# Patient Record
Sex: Male | Born: 1990
Health system: Southern US, Community
[De-identification: ages and names within clinical notes are randomized; demographics above are authoritative.]

## PROBLEM LIST (undated history)

## (undated) DIAGNOSIS — J45909 Unspecified asthma, uncomplicated: Secondary | ICD-10-CM

---
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
## 2011-01-06 NOTE — Op Note (Signed)
Andrew Torres, Andrew Torres              ACCOUNT NO.:  [id number]   MEDICAL RECORD NO.:  [id number]          PATIENT TYPE:  AMB   LOCATION:  DSC                          FACILITY:  [facility]   PHYSICIAN:  Lucky Cowboy, MD         DATE OF BIRTH:  1990-11-18   DATE OF PROCEDURE:  02/14/2008  DATE OF DISCHARGE:                               OPERATIVE REPORT   PREOPERATIVE DIAGNOSIS:  Closed nasal fracture.   POSTOPERATIVE DIAGNOSIS:  Closed nasal fracture.   PROCEDURE:  Closed reduction, nasal fracture.   ANESTHESIA:  General endotracheal anesthesia.   ESTIMATED BLOOD LOSS:  None.   COMPLICATIONS:  None.   INDICATIONS:  This patient is a 20 year old male who reports having a  basketball goal fall on his nose causing a severe right-sided external  nasal deviation, which was not present prior to the injury.  This is  certainly confirmed on physical examination in the office.  For these  reasons, closed reduction is performed.   PROCEDURE:  The patient was taken to the operating room and placed on  the table in the supine position.  He was then placed under general  endotracheal anesthesia and each of the nasal cavities decongested with  Afrin on the cottonoid pledget.  The nasal lift was then carefully  measured to the medial canthi.  The external pressure was applied to  medialize the bone.  Elevation of the bone was also performed on the  depressed left side.  Nasal packs were placed in each side to prevent  depression postoperatively.  A [place] splint was applied.  The patient  was awakened from anesthesia and taken to the Postanesthesia Care Unit  in stable condition.  There were no complications.      Lucky Cowboy, MD  Electronically Signed     SJ/MEDQ  D:  03/17/2008  T:  03/17/2008  Job:  [id number]   cc:   [facility]

## 2011-05-21 LAB — POCT HEMOGLOBIN-HEMACUE: Hemoglobin: 15.2

## 2012-01-29 ENCOUNTER — Telehealth: Payer: Self-pay | Admitting: *Deleted

## 2012-01-29 NOTE — Telephone Encounter (Signed)
Ben from [employer] to verify pt last OV with MD Lowne, noted 03-2009

## 2013-10-27 ENCOUNTER — Telehealth: Payer: Self-pay

## 2013-10-27 NOTE — Telephone Encounter (Signed)
Left message for call back Non-identifiable  New patient 

## 2013-10-31 ENCOUNTER — Telehealth: Payer: Self-pay | Admitting: Family Medicine

## 2013-10-31 ENCOUNTER — Ambulatory Visit (INDEPENDENT_AMBULATORY_CARE_PROVIDER_SITE_OTHER): Payer: BC Managed Care – PPO | Admitting: Family Medicine

## 2013-10-31 ENCOUNTER — Encounter: Payer: Self-pay | Admitting: Family Medicine

## 2013-10-31 VITALS — BP 112/70 | HR 68 | Temp 98.5°F | Ht 70.0 in | Wt 200.8 lb

## 2013-10-31 DIAGNOSIS — Z23 Encounter for immunization: Secondary | ICD-10-CM

## 2013-10-31 DIAGNOSIS — Z0000 Encounter for general adult medical examination without abnormal findings: Secondary | ICD-10-CM

## 2013-10-31 DIAGNOSIS — N469 Male infertility, unspecified: Secondary | ICD-10-CM | POA: Insufficient documentation

## 2013-10-31 LAB — CBC WITH DIFFERENTIAL/PLATELET
BASOS PCT: 0.7 % (ref 0.0–3.0)
Basophils Absolute: 0 10*3/uL (ref 0.0–0.1)
EOS ABS: 0.1 10*3/uL (ref 0.0–0.7)
Eosinophils Relative: 1.6 % (ref 0.0–5.0)
HCT: 45 % (ref 39.0–52.0)
Hemoglobin: 15.4 g/dL (ref 13.0–17.0)
LYMPHS ABS: 1.6 10*3/uL (ref 0.7–4.0)
Lymphocytes Relative: 34.2 % (ref 12.0–46.0)
MCHC: 34.2 g/dL (ref 30.0–36.0)
MCV: 96 fl (ref 78.0–100.0)
MONO ABS: 0.4 10*3/uL (ref 0.1–1.0)
Monocytes Relative: 8.6 % (ref 3.0–12.0)
NEUTROS PCT: 54.9 % (ref 43.0–77.0)
Neutro Abs: 2.5 10*3/uL (ref 1.4–7.7)
PLATELETS: 234 10*3/uL (ref 150.0–400.0)
RBC: 4.69 Mil/uL (ref 4.22–5.81)
RDW: 12.4 % (ref 11.5–14.6)
WBC: 4.6 10*3/uL (ref 4.5–10.5)

## 2013-10-31 LAB — BASIC METABOLIC PANEL
BUN: 12 mg/dL (ref 6–23)
CHLORIDE: 105 meq/L (ref 96–112)
CO2: 27 meq/L (ref 19–32)
CREATININE: 1 mg/dL (ref 0.4–1.5)
Calcium: 9.4 mg/dL (ref 8.4–10.5)
GFR: 123.65 mL/min (ref >60.00)
Glucose, Bld: 77 mg/dL (ref 70–99)
Potassium: 4 mEq/L (ref 3.5–5.1)
Sodium: 139 mEq/L (ref 135–145)

## 2013-10-31 LAB — LIPID PANEL
CHOL/HDL RATIO: 3
CHOLESTEROL: 156 mg/dL (ref 0–200)
HDL: 54.2 mg/dL (ref >39.00)
LDL Cholesterol: 88 mg/dL (ref 0–99)
TRIGLYCERIDES: 71 mg/dL (ref 0.0–149.0)
VLDL: 14.2 mg/dL (ref 0.0–40.0)

## 2013-10-31 LAB — TSH: TSH: 0.49 u[IU]/mL (ref 0.35–5.50)

## 2013-10-31 NOTE — Telephone Encounter (Signed)
Relevant patient education assigned to patient using Emmi. ° °

## 2013-10-31 NOTE — Patient Instructions (Signed)
Preventive Care for Adults, Male A healthy lifestyle and preventive care can promote health and wellness. Preventive health guidelines for men include the following key practices:  A routine yearly physical is a good way to check with your health care provider about your health and preventative screening. It is a chance to share any concerns and updates on your health and to receive a thorough exam.  Visit your dentist for a routine exam and preventative care every 6 months. Brush your teeth twice a day and floss once a day. Good oral hygiene prevents tooth decay and gum disease.  The frequency of eye exams is based on your age, health, family medical history, use of contact lenses, and other factors. Follow your health care provider's recommendations for frequency of eye exams.  Eat a healthy diet. Foods such as vegetables, fruits, whole grains, low-fat dairy products, and lean protein foods contain the nutrients you need without too many calories. Decrease your intake of foods high in solid fats, added sugars, and salt. Eat the right amount of calories for you.Get information about a proper diet from your health care provider, if necessary.  Regular physical exercise is one of the most important things you can do for your health. Most adults should get at least 150 minutes of moderate-intensity exercise (any activity that increases your heart rate and causes you to sweat) each week. In addition, most adults need muscle-strengthening exercises on 2 or more days a week.  Maintain a healthy weight. The body mass index (BMI) is a screening tool to identify possible weight problems. It provides an estimate of body fat based on height and weight. Your health care provider can find your BMI and can help you achieve or maintain a healthy weight.For adults 20 years and older:  A BMI below 18.5 is considered underweight.  A BMI of 18.5 to 24.9 is normal.  A BMI of 25 to 29.9 is considered  overweight.  A BMI of 30 and above is considered obese.  Maintain normal blood lipids and cholesterol levels by exercising and minimizing your intake of saturated fat. Eat a balanced diet with plenty of fruit and vegetables. Blood tests for lipids and cholesterol should begin at age 42 and be repeated every 5 years. If your lipid or cholesterol levels are high, you are over 50, or you are at high risk for heart disease, you may need your cholesterol levels checked more frequently.Ongoing high lipid and cholesterol levels should be treated with medicines if diet and exercise are not working.  If you smoke, find out from your health care provider how to quit. If you do not use tobacco, do not start.  Lung cancer screening is recommended for adults aged 24 80 years who are at high risk for developing lung cancer because of a history of smoking. A yearly low-dose CT scan of the lungs is recommended for people who have at least a 30-pack-year history of smoking and are a current smoker or have quit within the past 15 years. A pack year of smoking is smoking an average of 1 pack of cigarettes a day for 1 year (for example: 1 pack a day for 30 years or 2 packs a day for 15 years). Yearly screening should continue until the smoker has stopped smoking for at least 15 years. Yearly screening should be stopped for people who develop a health problem that would prevent them from having lung cancer treatment.  If you choose to drink alcohol, do not have  more than 2 drinks per day. One drink is considered to be 12 ounces (355 mL) of beer, 5 ounces (148 mL) of wine, or 1.5 ounces (44 mL) of liquor.  Avoid use of street drugs. Do not share needles with anyone. Ask for help if you need support or instructions about stopping the use of drugs.  High blood pressure causes heart disease and increases the risk of stroke. Your blood pressure should be checked at least every 1 2 years. Ongoing high blood pressure should be  treated with medicines, if weight loss and exercise are not effective.  If you are 75 23 years old, ask your health care provider if you should take aspirin to prevent heart disease.  Diabetes screening involves taking a blood sample to check your fasting blood sugar level. This should be done once every 3 years, after age 19, if you are within normal weight and without risk factors for diabetes. Testing should be considered at a younger age or be carried out more frequently if you are overweight and have at least 1 risk factor for diabetes.  Colorectal cancer can be detected and often prevented. Most routine colorectal cancer screening begins at the age of 47 and continues through age 80. However, your health care provider may recommend screening at an earlier age if you have risk factors for colon cancer. On a yearly basis, your health care provider may provide home test kits to check for hidden blood in the stool. Use of a small camera at the end of a tube to directly examine the colon (sigmoidoscopy or colonoscopy) can detect the earliest forms of colorectal cancer. Talk to your health care provider about this at age 66, when routine screening begins. Direct exam of the colon should be repeated every 5 10 years through age 19, unless early forms of precancerous polyps or small growths are found.  People who are at an increased risk for hepatitis B should be screened for this virus. You are considered at high risk for hepatitis B if:  You were born in a country where hepatitis B occurs often. Talk with your health care provider about which countries are considered high-risk.  Your parents were born in a high-risk country and you have not received a shot to protect against hepatitis B (hepatitis B vaccine).  You have HIV or AIDS.  You use needles to inject street drugs.  You live with, or have sex with, someone who has hepatitis B.  You are a man who has sex with other men (MSM).  You get  hemodialysis treatment.  You take certain medicines for conditions such as cancer, organ transplantation, and autoimmune conditions.  Hepatitis C blood testing is recommended for all people born from [birth year] through 1965 and any individual with known risks for hepatitis C.  Practice safe sex. Use condoms and avoid high-risk sexual practices to reduce the spread of sexually transmitted infections (STIs). STIs include gonorrhea, chlamydia, syphilis, trichomonas, herpes, HPV, and human immunodeficiency virus (HIV). Herpes, HIV, and HPV are viral illnesses that have no cure. They can result in disability, cancer, and death.  A one-time screening for abdominal aortic aneurysm (AAA) and surgical repair of large AAAs by ultrasound are recommended for men ages 94 to 74 years who are current or former smokers.  Healthy men should no longer receive prostate-specific antigen (PSA) blood tests as part of routine cancer screening. Talk with your health care provider about prostate cancer screening.  Testicular cancer screening is not recommended  for adult males who have no symptoms. Screening includes self-exam, a health care provider exam, and other screening tests. Consult with your health care provider about any symptoms you have or any concerns you have about testicular cancer.  Use sunscreen. Apply sunscreen liberally and repeatedly throughout the day. You should seek shade when your shadow is shorter than you. Protect yourself by wearing long sleeves, pants, a wide-brimmed hat, and sunglasses year round, whenever you are outdoors.  Once a month, do a whole-body skin exam, using a mirror to look at the skin on your back. Tell your health care provider about new moles, moles that have irregular borders, moles that are larger than a pencil eraser, or moles that have changed in shape or color.  Stay current with required vaccines (immunizations).  Influenza vaccine. All adults should be immunized every  year.  Tetanus, diphtheria, and acellular pertussis (Td, Tdap) vaccine. An adult who has not previously received Tdap or who does not know his vaccine status should receive 1 dose of Tdap. This initial dose should be followed by tetanus and diphtheria toxoids (Td) booster doses every 10 years. Adults with an unknown or incomplete history of completing a 3-dose immunization series with Td-containing vaccines should begin or complete a primary immunization series including a Tdap dose. Adults should receive a Td booster every 10 years.  Varicella vaccine. An adult without evidence of immunity to varicella should receive 2 doses or a second dose if he has previously received 1 dose.  Human papillomavirus (HPV) vaccine. Males aged 44 21 years who have not received the vaccine previously should receive the 3-dose series. Males aged 43 26 years may be immunized. Immunization is recommended through the age of 50 years for any male who has sex with males and did not get any or all doses earlier. Immunization is recommended for any person with an immunocompromised condition through the age of 23 years if he did not get any or all doses earlier. During the 3-dose series, the second dose should be obtained 4 8 weeks after the first dose. The third dose should be obtained 24 weeks after the first dose and 16 weeks after the second dose.  Zoster vaccine. One dose is recommended for adults aged 96 years or older unless certain conditions are present.  Measles, mumps, and rubella (MMR) vaccine. Adults born before 55 generally are considered immune to measles and mumps. Adults born in [birth year] or later should have 1 or more doses of MMR vaccine unless there is a contraindication to the vaccine or there is laboratory evidence of immunity to each of the three diseases. A routine second dose of MMR vaccine should be obtained at least 28 days after the first dose for students attending postsecondary schools, health care  workers, or international travelers. People who received inactivated measles vaccine or an unknown type of measles vaccine during 1963 1967 should receive 2 doses of MMR vaccine. People who received inactivated mumps vaccine or an unknown type of mumps vaccine before 1979 and are at high risk for mumps infection should consider immunization with 2 doses of MMR vaccine. Unvaccinated health care workers born before [birth year] who lack laboratory evidence of measles, mumps, or rubella immunity or laboratory confirmation of disease should consider measles and mumps immunization with 2 doses of MMR vaccine or rubella immunization with 1 dose of MMR vaccine.  Pneumococcal 13-valent conjugate (PCV13) vaccine. When indicated, a person who is uncertain of his immunization history and has no record of immunization  should receive the PCV13 vaccine. An adult aged 67 years or older who has certain medical conditions and has not been previously immunized should receive 1 dose of PCV13 vaccine. This PCV13 should be followed with a dose of pneumococcal polysaccharide (PPSV23) vaccine. The PPSV23 vaccine dose should be obtained at least 8 weeks after the dose of PCV13 vaccine. An adult aged 79 years or older who has certain medical conditions and previously received 1 or more doses of PPSV23 vaccine should receive 1 dose of PCV13. The PCV13 vaccine dose should be obtained 1 or more years after the last PPSV23 vaccine dose.  Pneumococcal polysaccharide (PPSV23) vaccine. When PCV13 is also indicated, PCV13 should be obtained first. All adults aged 74 years and older should be immunized. An adult younger than age 50 years who has certain medical conditions should be immunized. Any person who resides in a nursing home or long-term care facility should be immunized. An adult smoker should be immunized. People with an immunocompromised condition and certain other conditions should receive both PCV13 and PPSV23 vaccines. People with human  immunodeficiency virus (HIV) infection should be immunized as soon as possible after diagnosis. Immunization during chemotherapy or radiation therapy should be avoided. Routine use of PPSV23 vaccine is not recommended for American Indians, [place] Natives, or people younger than 65 years unless there are medical conditions that require PPSV23 vaccine. When indicated, people who have unknown immunization and have no record of immunization should receive PPSV23 vaccine. One-time revaccination 5 years after the first dose of PPSV23 is recommended for people aged 41 64 years who have chronic kidney failure, nephrotic syndrome, asplenia, or immunocompromised conditions. People who received 1 2 doses of PPSV23 before age 15 years should receive another dose of PPSV23 vaccine at age 48 years or later if at least 5 years have passed since the previous dose. Doses of PPSV23 are not needed for people immunized with PPSV23 at or after age 69 years.  Meningococcal vaccine. Adults with asplenia or persistent complement component deficiencies should receive 2 doses of quadrivalent meningococcal conjugate (MenACWY-D) vaccine. The doses should be obtained at least 2 months apart. Microbiologists working with certain meningococcal bacteria, [place] recruits, people at risk during an outbreak, and people who travel to or live in countries with a high rate of meningitis should be immunized. A first-year college student up through age 7 years who is living in a residence hall should receive a dose if he did not receive a dose on or after his 16th birthday. Adults who have certain high-risk conditions should receive one or more doses of vaccine.  Hepatitis A vaccine. Adults who wish to be protected from this disease, have certain high-risk conditions, work with hepatitis A-infected animals, work in hepatitis A research labs, or travel to or work in countries with a high rate of hepatitis A should be immunized. Adults who were  previously unvaccinated and who anticipate close contact with an international adoptee during the first 60 days after arrival in the [place] from a country with a high rate of hepatitis A should be immunized.  Hepatitis B vaccine. Adults who wish to be protected from this disease, have certain high-risk conditions, may be exposed to blood or other infectious body fluids, are household contacts or sex partners of hepatitis B positive people, are clients or workers in certain care facilities, or travel to or work in countries with a high rate of hepatitis B should be immunized.  Haemophilus influenzae type b (Hib) vaccine. A  previously unvaccinated person with asplenia or sickle cell disease or having a scheduled splenectomy should receive 1 dose of Hib vaccine. Regardless of previous immunization, a recipient of a hematopoietic stem cell transplant should receive a 3-dose series 6 12 months after his successful transplant. Hib vaccine is not recommended for adults with HIV infection. Preventive Service / Frequency Ages 62 to 3  Blood pressure check.** / Every 1 to 2 years.  Lipid and cholesterol check.** / Every 5 years beginning at age 43.  Hepatitis C blood test.** / For any individual with known risks for hepatitis C.  Skin self-exam. / Monthly.  Influenza vaccine. / Every year.  Tetanus, diphtheria, and acellular pertussis (Tdap, Td) vaccine.** / Consult your health care provider. 1 dose of Td every 10 years.  Varicella vaccine.** / Consult your health care provider.  HPV vaccine. / 3 doses over 6 months, if 48 or younger.  Measles, mumps, rubella (MMR) vaccine.** / You need at least 1 dose of MMR if you were born in 1957 or later. You may also need a second dose.  Pneumococcal 13-valent conjugate (PCV13) vaccine.** / Consult your health care provider.  Pneumococcal polysaccharide (PPSV23) vaccine.** / 1 to 2 doses if you smoke cigarettes or if you have certain  conditions.  Meningococcal vaccine.** / 1 dose if you are age 8 to 70 years and a Market researcher living in a residence hall, or have one of several medical conditions. You may also need additional booster doses.  Hepatitis A vaccine.** / Consult your health care provider.  Hepatitis B vaccine.** / Consult your health care provider.  Haemophilus influenzae type b (Hib) vaccine.** / Consult your health care provider. Ages 48 to 32  Blood pressure check.** / Every 1 to 2 years.  Lipid and cholesterol check.** / Every 5 years beginning at age 38.  Lung cancer screening. / Every year if you are aged 40 80 years and have a 30-pack-year history of smoking and currently smoke or have quit within the past 15 years. Yearly screening is stopped once you have quit smoking for at least 15 years or develop a health problem that would prevent you from having lung cancer treatment.  Fecal occult blood test (FOBT) of stool. / Every year beginning at age 4 and continuing until age 70. You may not have to do this test if you get a colonoscopy every 10 years.  Flexible sigmoidoscopy** or colonoscopy.** / Every 5 years for a flexible sigmoidoscopy or every 10 years for a colonoscopy beginning at age 76 and continuing until age 62.  Hepatitis C blood test.** / For all people born from [birth year] through 1965 and any individual with known risks for hepatitis C.  Skin self-exam. / Monthly.  Influenza vaccine. / Every year.  Tetanus, diphtheria, and acellular pertussis (Tdap/Td) vaccine.** / Consult your health care provider. 1 dose of Td every 10 years.  Varicella vaccine.** / Consult your health care provider.  Zoster vaccine.** / 1 dose for adults aged 60 years or older.  Measles, mumps, rubella (MMR) vaccine.** / You need at least 1 dose of MMR if you were born in 1957 or later. You may also need a second dose.  Pneumococcal 13-valent conjugate (PCV13) vaccine.** / Consult your health care  provider.  Pneumococcal polysaccharide (PPSV23) vaccine.** / 1 to 2 doses if you smoke cigarettes or if you have certain conditions.  Meningococcal vaccine.** / Consult your health care provider.  Hepatitis A vaccine.** / Consult your health care  provider.  Hepatitis B vaccine.** / Consult your health care provider.  Haemophilus influenzae type b (Hib) vaccine.** / Consult your health care provider. Ages 65 and over  Blood pressure check.** / Every 1 to 2 years.  Lipid and cholesterol check.**/ Every 5 years beginning at age 20.  Lung cancer screening. / Every year if you are aged 55 80 years and have a 30-pack-year history of smoking and currently smoke or have quit within the past 15 years. Yearly screening is stopped once you have quit smoking for at least 15 years or develop a health problem that would prevent you from having lung cancer treatment.  Fecal occult blood test (FOBT) of stool. / Every year beginning at age 50 and continuing until age 75. You may not have to do this test if you get a colonoscopy every 10 years.  Flexible sigmoidoscopy** or colonoscopy.** / Every 5 years for a flexible sigmoidoscopy or every 10 years for a colonoscopy beginning at age 50 and continuing until age 75.  Hepatitis C blood test.** / For all people born from 1945 through 1965 and any individual with known risks for hepatitis C.  Abdominal aortic aneurysm (AAA) screening.** / A one-time screening for ages 65 to 75 years who are current or former smokers.  Skin self-exam. / Monthly.  Influenza vaccine. / Every year.  Tetanus, diphtheria, and acellular pertussis (Tdap/Td) vaccine.** / 1 dose of Td every 10 years.  Varicella vaccine.** / Consult your health care provider.  Zoster vaccine.** / 1 dose for adults aged 60 years or older.  Pneumococcal 13-valent conjugate (PCV13) vaccine.** / Consult your health care provider.  Pneumococcal polysaccharide (PPSV23) vaccine.** / 1 dose for all  adults aged 65 years and older.  Meningococcal vaccine.** / Consult your health care provider.  Hepatitis A vaccine.** / Consult your health care provider.  Hepatitis B vaccine.** / Consult your health care provider.  Haemophilus influenzae type b (Hib) vaccine.** / Consult your health care provider. **Family history and personal history of risk and conditions may change your health care provider's recommendations. Document Released: 10/06/2001 Document Revised: 05/31/2013 Document Reviewed: 01/05/2011 [employer] Patient Information 2014 [employer].  

## 2013-10-31 NOTE — Progress Notes (Signed)
Pre visit review using our clinic review tool, if applicable. No additional management support is needed unless otherwise documented below in the visit note. 

## 2013-10-31 NOTE — Progress Notes (Signed)
Patient ID: Andrew Torres, male   DOB: 1991/04/15, 23 y.o.   MRN: [id number]   Subjective:    Patient ID: Andrew HearingDonovan I Torres, male    DOB: 1991/04/15, 23 y.o.   MRN: [id number] HPI Pt is here for cpe and c/o concerns with fertility.  Pt is trying to have a baby with his girlfriend.  They have been trying for over a year and she is concerned there is something wrong with him.     History reviewed. No pertinent past medical history. No current outpatient prescriptions on file prior to visit.   No current facility-administered medications on file prior to visit.   History   Social History  . Marital Status: Single    Spouse Name: N/A    Number of Children: N/A  . Years of Education: N/A   Occupational History  . student     criminal justice   Social History Main Topics  . Smoking status: Current Every Day Smoker -- 0.50 packs/day for 7 years    Types: Cigarettes    Start date: 11/01/2006  . Smokeless tobacco: Never Used  . Alcohol Use: Yes  . Drug Use: No  . Sexual Activity: Yes    Partners: Female   Other Topics Concern  . Not on file   Social History Narrative   Exercise-- no   Criminal justice student   Family History  Problem Relation Age of Onset  . Diabetes Maternal Grandfather    No Known Allergies        Objective:    BP 112/70  Pulse 68  Temp(Src) 98.5 F (36.9 C) (Oral)  Ht 5\' 10"  (1.778 m)  Wt 200 lb 12.8 oz (91.082 kg)  BMI 28.81 kg/m2  SpO2 98% General appearance: alert, cooperative, appears stated age and no distress Head: Normocephalic, without obvious abnormality, atraumatic Eyes: conjunctivae/corneas clear. PERRL, EOM's intact. Fundi benign. Ears: normal TM's and external ear canals both ears Nose: Nares normal. Septum midline. Mucosa normal. No drainage or sinus tenderness. Throat: lips, mucosa, and tongue normal; teeth and gums normal Neck: no adenopathy, no carotid bruit, no JVD, supple, symmetrical, trachea midline and thyroid not  enlarged, symmetric, no tenderness/mass/nodules Back: symmetric, no curvature. ROM normal. No CVA tenderness. Lungs: clear to auscultation bilaterally Chest wall: no tenderness Heart: regular rate and rhythm, S1, S2 normal, no murmur, click, rub or gallop Abdomen: soft, non-tender; bowel sounds normal; no masses,  no organomegaly Male genitalia: normal, penis: no lesions or discharge. testes: no masses or tenderness. no hernias Rectal: normal tone, normal prostate, no masses or tenderness Extremities: extremities normal, atraumatic, no cyanosis or edema Pulses: 2+ and symmetric Skin: Skin color, texture, turgor normal. No rashes or lesions Lymph nodes: Cervical, supraclavicular, and axillary nodes normal. Neurologic: Alert and oriented X 3, normal strength and tone. Normal symmetric reflexes. Normal coordination and gait        Assessment & Plan:  1-Preventative health care Check labs,  See AVS - Basic metabolic panel - CBC with Differential - Hepatic function panel - Lipid panel - POCT urinalysis dipstick - TSH  3. 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 D/w pt age and school and he states they want to have a baby.  I also discussed importance of her getting checked as well.

## 2013-11-01 LAB — HEPATIC FUNCTION PANEL
ALT: 31 U/L (ref 0–53)
AST: 26 U/L (ref 0–37)
Albumin: 4.2 g/dL (ref 3.5–5.2)
Alkaline Phosphatase: 48 U/L (ref 39–117)
BILIRUBIN DIRECT: 0 mg/dL (ref 0.0–0.3)
BILIRUBIN TOTAL: 0.6 mg/dL (ref 0.3–1.2)
TOTAL PROTEIN: 7.4 g/dL (ref 6.0–8.3)

## 2013-11-01 LAB — HSV 2 ANTIBODY, IGG: HSV 2 Glycoprotein G Ab, IgG: <0.1 IV

## 2013-11-01 LAB — GC/CHLAMYDIA PROBE AMP
CT PROBE, AMP APTIMA: POSITIVE — AB
GC PROBE AMP APTIMA: NEGATIVE

## 2013-11-01 LAB — RPR

## 2013-11-01 LAB — HIV ANTIBODY (ROUTINE TESTING W REFLEX): HIV: NONREACTIVE

## 2013-11-02 ENCOUNTER — Telehealth: Payer: Self-pay

## 2013-11-02 MED ORDER — AZITHROMYCIN 250 MG PO TABS: ORAL_TABLET | ORAL | Status: DC

## 2013-11-02 NOTE — Telephone Encounter (Signed)
Message copied by Arnette NorrisPAYNE, Yukio Bisping P on Thu Nov 02, 2013 10:59 AM ------      Message from: Lelon PerlaLOWNE, YVONNE R      Created: Wed Nov 01, 2013 11:48 AM       + chlamydia----   zithromax 250mg   #4  1000mg  x1       Still waiting for others ------

## 2013-11-02 NOTE — Telephone Encounter (Addendum)
Discussed with patient and he voiced understanding, advised e and his partner both will need to be treated, I encourage the use of condoms and made aware this information will be reported to the local Health department for statistical purposes. Answered all questions in reference to mode of transmission and ensuring to complete the treatment of both parties prior to relations to decrease the chances of reinfection. He voiced understanding and did not have anymore questions at this time. Info sent to the health department      KP

## 2017-03-26 ENCOUNTER — Emergency Department (HOSPITAL_BASED_OUTPATIENT_CLINIC_OR_DEPARTMENT_OTHER)
Admission: EM | Admit: 2017-03-26 | Discharge: 2017-03-26 | Disposition: A | Discharge status: Home / Self Care | Payer: Self-pay | Attending: Emergency Medicine | Admitting: Emergency Medicine

## 2017-03-26 ENCOUNTER — Encounter (HOSPITAL_BASED_OUTPATIENT_CLINIC_OR_DEPARTMENT_OTHER): Payer: Self-pay | Admitting: *Deleted

## 2017-03-26 DIAGNOSIS — J45909 Unspecified asthma, uncomplicated: Secondary | ICD-10-CM | POA: Insufficient documentation

## 2017-03-26 DIAGNOSIS — J069 Acute upper respiratory infection, unspecified: Secondary | ICD-10-CM | POA: Insufficient documentation

## 2017-03-26 DIAGNOSIS — R05 Cough: Secondary | ICD-10-CM | POA: Insufficient documentation

## 2017-03-26 DIAGNOSIS — B9789 Other viral agents as the cause of diseases classified elsewhere: Secondary | ICD-10-CM

## 2017-03-26 DIAGNOSIS — F17210 Nicotine dependence, cigarettes, uncomplicated: Secondary | ICD-10-CM | POA: Insufficient documentation

## 2017-03-26 DIAGNOSIS — R0981 Nasal congestion: Secondary | ICD-10-CM | POA: Insufficient documentation

## 2017-03-26 HISTORY — DX: Unspecified asthma, uncomplicated: J45.909

## 2017-03-26 MED ORDER — GUAIFENESIN 100 MG/5ML PO SYRP: 100.0000 mg | ORAL_SOLUTION | ORAL | 0 refills | Status: DC | PRN

## 2017-03-26 MED ORDER — IBUPROFEN 800 MG PO TABS: 800.0000 mg | ORAL_TABLET | Freq: Three times a day (TID) | ORAL | 0 refills | Status: DC

## 2017-03-26 MED ORDER — IBUPROFEN 800 MG PO TABS
800.0000 mg | ORAL_TABLET | Freq: Once | ORAL | Status: AC
Start: 2017-03-26 — End: 2017-03-26
  Administered 2017-03-26: 800 mg via ORAL
  Filled 2017-03-26: qty 1

## 2017-03-26 MED FILL — ROBAFEN 100 MG/5 ML SYRUP: 100 | 4 days supply | Qty: 118 | Fill #0

## 2017-03-26 NOTE — Discharge Instructions (Signed)
Return to the ED with any concerns including difficulty breathing, vomiting and not able to keep down liquids, decreased urine output, decreased level of alertness/lethargy, or any other alarming symptoms  °

## 2017-03-26 NOTE — ED Provider Notes (Signed)
[facility]   CSN: [id number] Arrival date & time: 03/26/17  0946     History   Chief Complaint Chief Complaint  Patient presents with  . Generalized Body Aches  . Sore Throat    HPI Andrew Torres is a 26 y.o. male.  HPI  Pt presenting with c/o congestion, body aches, mild sore throat, mild cough.  He states symptoms started yesterday while he was at work, worse this morning.  He came in to be checked and will need a work note.  He has tried a multi symptom medicine that he did not feel helped very much.  Cough is nonproductive.  He does not feel short of breath or tightness in his chest.  No fever/chills.  Does c/o diffuse body aches.  No specific sick contacts or recent travel.  There are no other associated systemic symptoms, there are no other alleviating or modifying factors.   Past Medical History:  Diagnosis Date  . Asthma     Patient Active Problem List   Diagnosis Date Noted  . Preventative health care 10/31/2013  . 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 NOSE SURGERY  2006   Broken Nose       Home Medications    Prior to Admission medications   Medication Sig Start Date End Date Taking? Authorizing Provider  azithromycin (ZITHROMAX) 250 MG tablet Take all 4 tablets today 11/02/13   Zola ButtonLowne Chase, Grayling CongressYvonne R, DO  guaifenesin (ROBITUSSIN) 100 MG/5ML syrup Take 5-10 mLs (100-200 mg total) by mouth every 4 (four) hours as needed for cough. 03/26/17   Mabe, Latanya MaudlinMartha L, MD  ibuprofen (ADVIL,MOTRIN) 800 MG tablet Take 1 tablet (800 mg total) by mouth 3 (three) times daily. 03/26/17   Mabe, Latanya MaudlinMartha L, MD    Family History Family History  Problem Relation Age of Onset  . Diabetes Maternal Grandfather     Social History Social History  Substance Use Topics  . Smoking status: Current Every Day Smoker    Packs/day: 0.50    Years: 7.00    Types: Cigarettes    Start date: 11/01/2006  .  Smokeless tobacco: Never Used  . Alcohol use Yes     Comment: occ     Allergies   Patient has no known allergies.   Review of Systems Review of Systems  ROS reviewed and all otherwise negative except for mentioned in HPI   Physical Exam Updated Vital Signs BP 128/85 (BP Location: Right Arm)   Pulse 67   Temp 99.2 F (37.3 C) (Oral)   Resp 16   Ht 5\' 9"  (1.753 m)   Wt 95.3 kg (210 lb)   SpO2 99%   BMI 31.01 kg/m  Vitals reviewed Physical Exam Physical Examination: General appearance - alert, well appearing, and in no distress Mental status - alert, oriented to person, place, and time Eyes - no conjunctival injection, no scleral icterus Mouth - mucous membranes moist, pharynx normal without lesions, no significant erythema of OP, no exudate, palate symmetric, uvula midline Neck - supple, no significant adenopathy Chest - clear to auscultation, no wheezes, rales or rhonchi, symmetric air entry Heart - normal rate, regular rhythm, normal S1, S2, no murmurs, rubs, clicks or gallops Abdomen - soft, nontender, nondistended, no masses or organomegaly Neurological - alert, oriented, normal speech Extremities - peripheral pulses normal, no pedal edema, no clubbing or cyanosis Skin - normal coloration and turgor, no rashes  ED Treatments / Results  Labs (all labs ordered are listed, but only abnormal results are displayed) Labs Reviewed - No data to display  EKG  EKG Interpretation None       Radiology No results found.  Procedures Procedures (including critical care time)  Medications Ordered in ED Medications  ibuprofen (ADVIL,MOTRIN) tablet 800 mg (800 mg Oral Given 03/26/17 1032)     Initial Impression / Assessment and Plan / ED Course  I have reviewed the triage vital signs and the nursing notes.  Pertinent labs & imaging results that were available during my care of the patient were reviewed by me and considered in my medical decision making (see chart for  details).     Pt presenting with c/o nasal congestion, mild cough, body aches, mild sore throat.  Normal respiratory effort and no wheezing, vital signs reassuring- doubt pneumonia.  No significant asthma exacerbation.  Doubt strep throat given constellation of symptoms being more likely viral.  D/w patient about symptomatic treatments, hydration.  Discharged with strict return precautions.  Pt agreeable with plan.  Work note provided  Final Clinical Impressions(s) / ED Diagnoses   Final diagnoses:  Viral URI with cough    New Prescriptions Discharge Medication List as of 03/26/2017 10:21 AM    START taking these medications   Details  guaifenesin (ROBITUSSIN) 100 MG/5ML syrup Take 5-10 mLs (100-200 mg total) by mouth every 4 (four) hours as needed for cough., Starting Fri 03/26/2017, Print    ibuprofen (ADVIL,MOTRIN) 800 MG tablet Take 1 tablet (800 mg total) by mouth 3 (three) times daily., Starting Fri 03/26/2017, Print         Mabe, Latanya MaudlinMartha L, MD 03/26/17 [id number]

## 2017-03-26 NOTE — ED Triage Notes (Signed)
Pt reports generalized body aches, chills/sweats, sore throat and congested cough since this past Wednesday. Denies n/v/d, known insect/tick bites.

## 2017-11-15 ENCOUNTER — Emergency Department (HOSPITAL_BASED_OUTPATIENT_CLINIC_OR_DEPARTMENT_OTHER)
Admission: EM | Admit: 2017-11-15 | Discharge: 2017-11-15 | Disposition: A | Discharge status: Home / Self Care | Payer: BLUE CROSS/BLUE SHIELD | Attending: Emergency Medicine | Admitting: Emergency Medicine

## 2017-11-15 ENCOUNTER — Emergency Department (HOSPITAL_BASED_OUTPATIENT_CLINIC_OR_DEPARTMENT_OTHER): Payer: BLUE CROSS/BLUE SHIELD

## 2017-11-15 ENCOUNTER — Encounter (HOSPITAL_BASED_OUTPATIENT_CLINIC_OR_DEPARTMENT_OTHER): Payer: Self-pay

## 2017-11-15 ENCOUNTER — Other Ambulatory Visit: Payer: Self-pay

## 2017-11-15 DIAGNOSIS — F17210 Nicotine dependence, cigarettes, uncomplicated: Secondary | ICD-10-CM | POA: Diagnosis not present

## 2017-11-15 DIAGNOSIS — R072 Precordial pain: Secondary | ICD-10-CM | POA: Diagnosis not present

## 2017-11-15 DIAGNOSIS — J45909 Unspecified asthma, uncomplicated: Secondary | ICD-10-CM | POA: Insufficient documentation

## 2017-11-15 DIAGNOSIS — R079 Chest pain, unspecified: Secondary | ICD-10-CM | POA: Diagnosis present

## 2017-11-15 MED ORDER — GI COCKTAIL ~~LOC~~
30.0000 mL | Freq: Once | ORAL | Status: AC
  Administered 2017-11-15: 30 mL via ORAL
  Filled 2017-11-15: qty 30

## 2017-11-15 MED ORDER — IBUPROFEN 800 MG PO TABS: 800.0000 mg | ORAL_TABLET | Freq: Three times a day (TID) | ORAL | 0 refills | Status: DC

## 2017-11-15 MED FILL — IBUPROFEN 800 MG TAB: 800 | 7 days supply | Qty: 21 | Fill #0

## 2017-11-15 NOTE — ED Triage Notes (Signed)
Pt c/o center chest pain this am, still there and hasn't moved; denies n/v/SOB/diaophesis; no distress noted

## 2017-11-15 NOTE — ED Provider Notes (Signed)
[facility] EMERGENCY DEPARTMENT Provider Note   CSN: [id number] Arrival date & time: 11/15/17  1036     History   Chief Complaint Chief Complaint  Patient presents with  . Chest Pain    HPI Andrew Torres is a 27 y.o. male.  The history is provided by the patient.  Chest Pain   This is a recurrent problem. The current episode started more than 2 days ago. The problem occurs constantly. The problem has not changed since onset.The pain is associated with rest. The pain is present in the substernal region. The pain is moderate. The quality of the pain is described as dull. The pain does not radiate. Pertinent negatives include no abdominal pain, no back pain, no claudication, no cough, no diaphoresis, no dizziness, no exertional chest pressure, no fever, no headaches, no hemoptysis, no leg pain, no lower extremity edema, no malaise/fatigue, no nausea, no near-syncope, no numbness, no orthopnea, no palpitations, no PND, no shortness of breath, no sputum production, no syncope, no vomiting and no weakness. He has tried nothing for the symptoms. The treatment provided no relief. Risk factors include male gender.  Pertinent negatives for past medical history include no aneurysm, no COPD, no CHF and no MI.  Pertinent negatives for family medical history include: no aortic dissection and no Marfan's syndrome.  Procedure history is negative for cardiac catheterization.  Gets pain when he smokes too many cigarettes.    Past Medical History:  Diagnosis Date  . Asthma     Patient Active Problem List   Diagnosis Date Noted  . Preventative health care 10/31/2013  . 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 NOSE SURGERY  2006   Broken Nose        Home Medications    Prior to Admission medications   Medication Sig Start Date End Date Taking? Authorizing Provider  azithromycin (ZITHROMAX) 250 MG tablet Take all 4 tablets  today 11/02/13   Zola Button, Grayling Congress, DO  guaifenesin (ROBITUSSIN) 100 MG/5ML syrup Take 5-10 mLs (100-200 mg total) by mouth every 4 (four) hours as needed for cough. 03/26/17   Mabe, Latanya Maudlin, MD  ibuprofen (ADVIL,MOTRIN) 800 MG tablet Take 1 tablet (800 mg total) by mouth 3 (three) times daily. 03/26/17   Mabe, Latanya Maudlin, MD    Family History Family History  Problem Relation Age of Onset  . Diabetes Maternal Grandfather     Social History Social History   Tobacco Use  . Smoking status: Current Every Day Smoker    Packs/day: 0.50    Years: 7.00    Pack years: 3.50    Types: Cigarettes    Start date: 11/01/2006  . Smokeless tobacco: Never Used  Substance Use Topics  . Alcohol use: Yes    Comment: occ  . Drug use: No     Allergies   Patient has no known allergies.   Review of Systems Review of Systems  Constitutional: Negative for diaphoresis, fever and malaise/fatigue.  Respiratory: Negative for cough, hemoptysis, sputum production and shortness of breath.   Cardiovascular: Positive for chest pain. Negative for palpitations, orthopnea, claudication, leg swelling, syncope, PND and near-syncope.  Gastrointestinal: Negative for abdominal pain, nausea and vomiting.  Musculoskeletal: Negative for back pain.  Neurological: Negative for dizziness, weakness, numbness and headaches.  All other systems reviewed and are negative.    Physical Exam Updated Vital Signs BP 139/89 (BP Location: Right Arm)  Pulse 62   Temp 98.1 F (36.7 C) (Oral)   Resp 16   SpO2 100%   Physical Exam  Constitutional: He is oriented to person, place, and time. He appears well-developed and well-nourished. No distress.  HENT:  Head: Normocephalic and atraumatic.  Mouth/Throat: No oropharyngeal exudate.  Eyes: Pupils are equal, round, and reactive to light. Conjunctivae are normal.  Neck: Normal range of motion. Neck supple.  Cardiovascular: Normal rate, regular rhythm, normal heart sounds and  intact distal pulses.  Pulmonary/Chest: Effort normal and breath sounds normal. No stridor. He has no wheezes. He has no rales.  Abdominal: Soft. Bowel sounds are normal. He exhibits no mass. There is no tenderness. There is no rebound and no guarding.  Musculoskeletal: Normal range of motion. He exhibits no edema or tenderness.  Neurological: He is alert and oriented to person, place, and time. He displays normal reflexes. He exhibits normal muscle tone. Coordination normal.  Skin: Skin is warm and dry. Capillary refill takes less than 2 seconds.  Psychiatric: He has a normal mood and affect.     ED Treatments / Results  Labs (all labs ordered are listed, but only abnormal results are displayed) Labs Reviewed - No data to display  EKG EKG Interpretation  Date/Time:  Monday November 15 2017 10:45:33 EDT Ventricular Rate:  68 PR Interval:  166 QRS Duration: 88 QT Interval:  358 QTC Calculation: 380 R Axis:   60 Text Interpretation:  Normal sinus rhythm Confirmed by Nicanor AlconPalumbo, Kwali Wrinkle ([id number]) on 11/15/2017 10:55:23 AM   Radiology Dg Chest 2 View  Result Date: 11/15/2017 CLINICAL DATA:  Mid chest discomfort since this morning. History of childhood asthma, current smoker. EXAM: CHEST - 2 VIEW COMPARISON:  None in PACs FINDINGS: The lungs are well-expanded. There is no focal infiltrate. There is no pleural effusion. The heart and pulmonary vascularity are normal. The trachea is midline. The bony thorax is unremarkable. IMPRESSION: There is no active cardiopulmonary disease. Electronically Signed   By: David  SwazilandJordan M.D.   On: 11/15/2017 11:06    Procedures Procedures (including critical care time)  Medications Ordered in ED Medications  gi cocktail (Maalox,Lidocaine,Donnatal) (30 mLs Oral Given 11/15/17 1230)       Final Clinical Impressions(s) / ED Diagnoses   PERC negative wells 0 highly doubt PE in this low risk patient.  Doubt ACS.  EKG is normal.    Return for weakness,  numbness, changes in vision or speech, fevers >100.4 unrelieved by medication, shortness of breath, intractable vomiting, or diarrhea, abdominal pain, Inability to tolerate liquids or food, cough, altered mental status or any concerns. No signs of systemic illness or infection. The patient is nontoxic-appearing on exam and vital signs are within normal limits.   I have reviewed the triage vital signs and the nursing notes. Pertinent labs &imaging results that were available during my care of the patient were reviewed by me and considered in my medical decision making (see chart for details).  After history, exam, and medical workup I feel the patient has been appropriately medically screened and is safe for discharge home. Pertinent diagnoses were discussed with the patient. Patient was given return precautions.     Thatcher Doberstein, MD 11/15/17 1319

## 2017-11-15 NOTE — ED Notes (Signed)
NAD at this time. Pt is stable and going home.  

## 2017-11-15 NOTE — ED Notes (Signed)
ED Provider at bedside. 

## 2018-01-19 ENCOUNTER — Other Ambulatory Visit: Payer: Self-pay

## 2018-01-19 ENCOUNTER — Encounter (HOSPITAL_BASED_OUTPATIENT_CLINIC_OR_DEPARTMENT_OTHER): Payer: Self-pay

## 2018-01-19 ENCOUNTER — Emergency Department (HOSPITAL_BASED_OUTPATIENT_CLINIC_OR_DEPARTMENT_OTHER)
Admission: EM | Admit: 2018-01-19 | Discharge: 2018-01-19 | Disposition: A | Discharge status: Home / Self Care | Payer: BLUE CROSS/BLUE SHIELD | Attending: Emergency Medicine | Admitting: Emergency Medicine

## 2018-01-19 DIAGNOSIS — B9789 Other viral agents as the cause of diseases classified elsewhere: Secondary | ICD-10-CM | POA: Diagnosis not present

## 2018-01-19 DIAGNOSIS — J45909 Unspecified asthma, uncomplicated: Secondary | ICD-10-CM | POA: Diagnosis not present

## 2018-01-19 DIAGNOSIS — R05 Cough: Secondary | ICD-10-CM | POA: Diagnosis present

## 2018-01-19 DIAGNOSIS — Z79899 Other long term (current) drug therapy: Secondary | ICD-10-CM | POA: Insufficient documentation

## 2018-01-19 DIAGNOSIS — J069 Acute upper respiratory infection, unspecified: Secondary | ICD-10-CM | POA: Diagnosis not present

## 2018-01-19 LAB — RAPID STREP SCREEN (MED CTR MEBANE ONLY): Streptococcus, Group A Screen (Direct): NEGATIVE

## 2018-01-19 MED ORDER — SALINE SPRAY 0.65 % NA SOLN: 1.0000 | NASAL | 0 refills | Status: AC | PRN

## 2018-01-19 MED ORDER — CETIRIZINE HCL 10 MG PO TABS: 10.0000 mg | ORAL_TABLET | Freq: Every day | ORAL | 0 refills | Status: AC

## 2018-01-19 MED ORDER — GUAIFENESIN 100 MG/5ML PO SYRP: 100.0000 mg | ORAL_SOLUTION | ORAL | 0 refills | Status: AC | PRN

## 2018-01-19 NOTE — ED Notes (Signed)
Pt verbalizes understanding of d/c instructions and denies any further need at this time. 

## 2018-01-19 NOTE — Discharge Instructions (Signed)
As discussed, your rapid strep was negative today.  Your symptoms are consistent with a viral upper respiratory infection (cold).  Make sure that you stay well-hydrated and get some rest.  Use the nasal spray and antihistamines to help with congestion.  Alternate between ibuprofen and Tylenol for pain or fever as needed.  Use tea with honey, cough drops, throat sprays over-the-counter to help soothe your throat. Follow-up with your primary care provider.  Return if symptoms worsen, facial swelling, difficulty breathing, difficulty swallowing, drooling or other new concerning symptoms in the meantime.

## 2018-01-19 NOTE — ED Provider Notes (Signed)
[facility] EMERGENCY DEPARTMENT Provider Note   CSN: [id number] Arrival date & time: 01/19/18  1136     History   Chief Complaint Chief Complaint  Patient presents with  . Cough    HPI Andrew Torres is a 27 y.o. male with no past medical history presenting with sudden onset sore throat, cough, sneezing, nasal congestion since he woke up this morning.  Works outside in the heat and reports that he was having cold chills while at work today.  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  He has tried Claritin and without relief.  He states that this feels different and allergies more like he has a cold.  Denies fever, nausea, vomiting, chest pain or shortness of breath. He is up-to-date on his immunizations except for flu.  HPI  Past Medical History:  Diagnosis Date  . Asthma     Patient Active Problem List   Diagnosis Date Noted  . Preventative health care 10/31/2013  . 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 NOSE SURGERY  2006   Broken Nose        Home Medications    Prior to Admission medications   Medication Sig Start Date End Date Taking? Authorizing Provider  azithromycin (ZITHROMAX) 250 MG tablet Take all 4 tablets today 11/02/13   Zola Button, Grayling Congress, DO  cetirizine (ZYRTEC ALLERGY) 10 MG tablet Take 1 tablet (10 mg total) by mouth daily. 01/19/18   Georgiana Shore, PA-C  guaifenesin (ROBITUSSIN) 100 MG/5ML syrup Take 5-10 mLs (100-200 mg total) by mouth every 4 (four) hours as needed for cough. 01/19/18   Georgiana Shore, PA-C  ibuprofen (ADVIL,MOTRIN) 800 MG tablet Take 1 tablet (800 mg total) by mouth 3 (three) times daily. 03/26/17   Mabe, Latanya Maudlin, MD  ibuprofen (ADVIL,MOTRIN) 800 MG tablet Take 1 tablet (800 mg total) by mouth 3 (three) times daily. 11/15/17   Palumbo, April, MD  sodium chloride (OCEAN) 0.65 % SOLN nasal spray Place 1 spray  into both nostrils as needed for congestion. 01/19/18   Georgiana Shore, PA-C    Family History Family History  Problem Relation Age of Onset  . Diabetes Maternal Grandfather     Social History Social History   Tobacco Use  . Smoking status: Current Every Day Smoker    Packs/day: 0.50    Years: 7.00    Pack years: 3.50    Types: Cigarettes    Start date: 11/01/2006  . Smokeless tobacco: Never Used  Substance Use Topics  . Alcohol use: Yes    Comment: occ  . Drug use: No     Allergies   Patient has no known allergies.   Review of Systems Review of Systems  Constitutional: Positive for chills. Negative for diaphoresis, fatigue and fever.  HENT: Positive for congestion, sinus pressure, sneezing and sore throat. Negative for ear discharge, ear pain, facial swelling, tinnitus, trouble swallowing and voice change.   Eyes: Negative for photophobia, pain, discharge, redness, itching and visual disturbance.  Respiratory: Positive for cough. Negative for choking, chest tightness, shortness of breath, wheezing and stridor.   Cardiovascular: Negative for chest pain and palpitations.  Gastrointestinal: Negative for nausea and vomiting.  Genitourinary: Negative for difficulty urinating.  Musculoskeletal: Negative for arthralgias, back pain, gait problem, joint swelling, neck pain and neck stiffness.  Skin: Negative for color change, pallor and rash.  Neurological:  Negative for dizziness, facial asymmetry, weakness, light-headedness, numbness and headaches.     Physical Exam Updated Vital Signs BP 133/82 (BP Location: Left Arm)   Pulse 80   Temp 99.3 F (37.4 C) (Oral)   Resp 16   Ht  (1.727 m)   Wt 94.7 kg (208 lb 12.4 oz)   SpO2 100%   BMI 31.74 kg/m   Physical Exam  Constitutional: He is oriented to person, place, and time. He appears well-developed and well-nourished. No distress.  Afebrile, nontoxic-appearing, sitting comfortably in chair in no acute  distress.  HENT:  Head: Normocephalic and atraumatic.  Right Ear: External ear normal.  Left Ear: External ear normal.  Mouth/Throat: No oropharyngeal exudate.  Normal tympanic membranes bilaterally.  Oropharynx is erythematous without exudate.  Uvula is midline, arches intact, tolerating oral secretions well.  No PTA  Eyes: Conjunctivae and EOM are normal.  Neck: Normal range of motion. Neck supple.  Cardiovascular: Normal rate, regular rhythm, normal heart sounds and intact distal pulses.  No murmur heard. Pulmonary/Chest: Effort normal and breath sounds normal. No stridor. No respiratory distress. He has no wheezes. He has no rales.  Abdominal: He exhibits no distension.  Musculoskeletal: Normal range of motion. He exhibits no edema.  Neurological: He is alert and oriented to person, place, and time.  Skin: Skin is warm and dry. No rash noted. He is not diaphoretic. No erythema. No pallor.  Psychiatric: He has a normal mood and affect.  Nursing note and vitals reviewed.    ED Treatments / Results  Labs (all labs ordered are listed, but only abnormal results are displayed) Labs Reviewed  RAPID STREP SCREEN ([facility] ONLY)  CULTURE, GROUP A STREP [facility])    EKG None  Radiology No results found.  Procedures Procedures (including critical care time)  Medications Ordered in ED Medications - No data to display   Initial Impression / Assessment and Plan / ED Course  I have reviewed the triage vital signs and the nursing notes.  Pertinent labs & imaging results that were available during my care of the patient were reviewed by me and considered in my medical decision making (see chart for details).     Pt presents afebrile without tonsillar exudate, negative strep.  Pt does not appear dehydrated, but did discuss importance of water rehydration. Presentation non concerning for PTA or infxn spread to soft tissue. No trismus or uvula deviation.   Patients symptoms are  consistent with URI, likely viral etiology. Discussed that antibiotics are not indicated for viral infections. Pt will be discharged with symptomatic treatment.  Pt is hemodynamically stable & in NAD prior to dc.  Return precautions were discussed and patient understands and agrees with plan. Final Clinical Impressions(s) / ED Diagnoses   Final diagnoses:  Viral URI with cough    ED Discharge Orders        Ordered    guaifenesin (ROBITUSSIN) 100 MG/5ML syrup  Every 4 hours PRN     01/19/18 1504    cetirizine (ZYRTEC ALLERGY) 10 MG tablet  Daily     01/19/18 1504    sodium chloride (OCEAN) 0.65 % SOLN nasal spray  As needed     01/19/18 1504       Gregary Cromer 01/19/18 1508    Little, Ambrose Finland, MD 01/20/18 [id number]

## 2018-01-19 NOTE — ED Triage Notes (Signed)
C/o flu like sx started this am-NAD-steady gait

## 2018-01-22 LAB — CULTURE, GROUP A STREP (THRC)

## 2018-08-10 ENCOUNTER — Encounter (HOSPITAL_BASED_OUTPATIENT_CLINIC_OR_DEPARTMENT_OTHER): Payer: Self-pay | Admitting: *Deleted

## 2018-08-10 ENCOUNTER — Other Ambulatory Visit: Payer: Self-pay

## 2018-08-10 ENCOUNTER — Emergency Department (HOSPITAL_BASED_OUTPATIENT_CLINIC_OR_DEPARTMENT_OTHER)
Admission: EM | Admit: 2018-08-10 | Discharge: 2018-08-10 | Disposition: A | Discharge status: Home / Self Care | Payer: BLUE CROSS/BLUE SHIELD | Attending: Emergency Medicine | Admitting: Emergency Medicine

## 2018-08-10 DIAGNOSIS — Z79899 Other long term (current) drug therapy: Secondary | ICD-10-CM | POA: Diagnosis not present

## 2018-08-10 DIAGNOSIS — F17210 Nicotine dependence, cigarettes, uncomplicated: Secondary | ICD-10-CM | POA: Insufficient documentation

## 2018-08-10 DIAGNOSIS — R52 Pain, unspecified: Secondary | ICD-10-CM

## 2018-08-10 DIAGNOSIS — J45909 Unspecified asthma, uncomplicated: Secondary | ICD-10-CM | POA: Diagnosis not present

## 2018-08-10 DIAGNOSIS — B349 Viral infection, unspecified: Secondary | ICD-10-CM | POA: Diagnosis not present

## 2018-08-10 DIAGNOSIS — M7918 Myalgia, other site: Secondary | ICD-10-CM | POA: Diagnosis present

## 2018-08-10 MED ORDER — IBUPROFEN 800 MG PO TABS: 800.0000 mg | ORAL_TABLET | Freq: Three times a day (TID) | ORAL | 0 refills | Status: AC | PRN

## 2018-08-10 MED ORDER — IBUPROFEN 800 MG PO TABS
800.0000 mg | ORAL_TABLET | Freq: Once | ORAL | Status: AC
  Administered 2018-08-10: 800 mg via ORAL
  Filled 2018-08-10: qty 1

## 2018-08-10 MED FILL — IBUPROFEN 800 MG TAB: 800 | 3 days supply | Qty: 10 | Fill #0

## 2018-08-10 NOTE — ED Provider Notes (Signed)
[facility] EMERGENCY DEPARTMENT Provider Note   CSN: [id number] Arrival date & time: 08/10/18  0857     History   Chief Complaint Chief Complaint  Patient presents with  . Generalized Body Aches    HPI Andrew Torres is a 27 y.o. male.  27 year old male with history of asthma who presents with body aches.  Patient states that began having diffuse body aches and body weakness today including pain diffusely in his back.  He started having a cough yesterday associated with mild sore throat.  Wife currently ill.  Denies any fevers, vomiting, diarrhea, or rash.  Took Robitussin this morning for his symptoms.  Denies drug use.  The history is provided by the patient.    Past Medical History:  Diagnosis Date  . Asthma     Patient Active Problem List   Diagnosis Date Noted  . Preventative health care 10/31/2013  . 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 NOSE SURGERY  2006   Broken Nose        Home Medications    Prior to Admission medications   Medication Sig Start Date End Date Taking? Authorizing Provider  guaifenesin (ROBITUSSIN) 100 MG/5ML syrup Take 5-10 mLs (100-200 mg total) by mouth every 4 (four) hours as needed for cough. 01/19/18  Yes Mathews Robinsons B, PA-C  cetirizine (ZYRTEC ALLERGY) 10 MG tablet Take 1 tablet (10 mg total) by mouth daily. 01/19/18   Georgiana Shore, PA-C  ibuprofen (ADVIL,MOTRIN) 800 MG tablet Take 1 tablet (800 mg total) by mouth every 8 (eight) hours as needed for fever, headache or moderate pain. 08/10/18   Jakwan Sally, Ambrose Finland, MD  sodium chloride (OCEAN) 0.65 % SOLN nasal spray Place 1 spray into both nostrils as needed for congestion. 01/19/18   Georgiana Shore, PA-C    Family History Family History  Problem Relation Age of Onset  . Diabetes Maternal Grandfather     Social History Social History   Tobacco Use  . Smoking status: Current Every Day Smoker     Packs/day: 0.50    Years: 7.00    Pack years: 3.50    Types: Cigarettes    Start date: 11/01/2006  . Smokeless tobacco: Never Used  Substance Use Topics  . Alcohol use: Yes    Comment: occ  . Drug use: No     Allergies   Patient has no known allergies.   Review of Systems Review of Systems All other systems reviewed and are negative except that which was mentioned in HPI   Physical Exam Updated Vital Signs BP 135/80   Pulse 84   Temp 99.7 F (37.6 C) (Oral)   Resp 16   Ht 5\' 9"  (1.753 m)   Wt 100.1 kg   SpO2 100%   BMI 32.59 kg/m   Physical Exam Vitals signs and nursing note reviewed.  Constitutional:      General: He is not in acute distress.    Appearance: He is well-developed.  HENT:     Head: Normocephalic and atraumatic.     Nose: Nose normal.     Mouth/Throat:     Mouth: Mucous membranes are moist.     Pharynx: Oropharynx is clear. No oropharyngeal exudate or posterior oropharyngeal erythema.  Eyes:     Conjunctiva/sclera: Conjunctivae normal.  Neck:     Musculoskeletal: Neck supple.  Cardiovascular:     Rate and Rhythm: Normal rate and regular rhythm.  Heart sounds: Normal heart sounds. No murmur.  Pulmonary:     Effort: Pulmonary effort is normal.     Breath sounds: Normal breath sounds.  Abdominal:     General: Bowel sounds are normal. There is no distension.     Palpations: Abdomen is soft.     Tenderness: There is no abdominal tenderness.  Lymphadenopathy:     Cervical: No cervical adenopathy.  Skin:    General: Skin is warm and dry.     Findings: No rash.  Neurological:     Mental Status: He is alert and oriented to person, place, and time.     Comments: Fluent speech  Psychiatric:        Judgment: Judgment normal.      ED Treatments / Results  Labs (all labs ordered are listed, but only abnormal results are displayed) Labs Reviewed - No data to display  EKG None  Radiology No results found.  Procedures Procedures  (including critical care time)  Medications Ordered in ED Medications  ibuprofen (ADVIL,MOTRIN) tablet 800 mg (has no administration in time range)     Initial Impression / Assessment and Plan / ED Course  I have reviewed the triage vital signs and the nursing notes.      Symptoms consistent with viral illness.  Reassuring vital signs here.  Discussed supportive measures and reviewed return precautions.  He voiced understanding. Final Clinical Impressions(s) / ED Diagnoses   Final diagnoses:  Body aches  Viral illness    ED Discharge Orders         Ordered    ibuprofen (ADVIL,MOTRIN) 800 MG tablet  Every 8 hours PRN     08/10/18 0949           Milo Schreier, Ambrose Finlandachel Morgan, MD 08/10/18 [phone]

## 2018-08-10 NOTE — ED Triage Notes (Signed)
Body aches and body weakness started this morning when he woke up and started coughing

## 2020-03-20 IMAGING — CR DG CHEST 2V
2 series · 2 of 2 positions shown · non-contrast
Comparison: None in PACs

CLINICAL DATA: Mid chest discomfort since this morning. History of
childhood asthma, current smoker.

EXAM:
CHEST - 2 VIEW

[w chest pa]
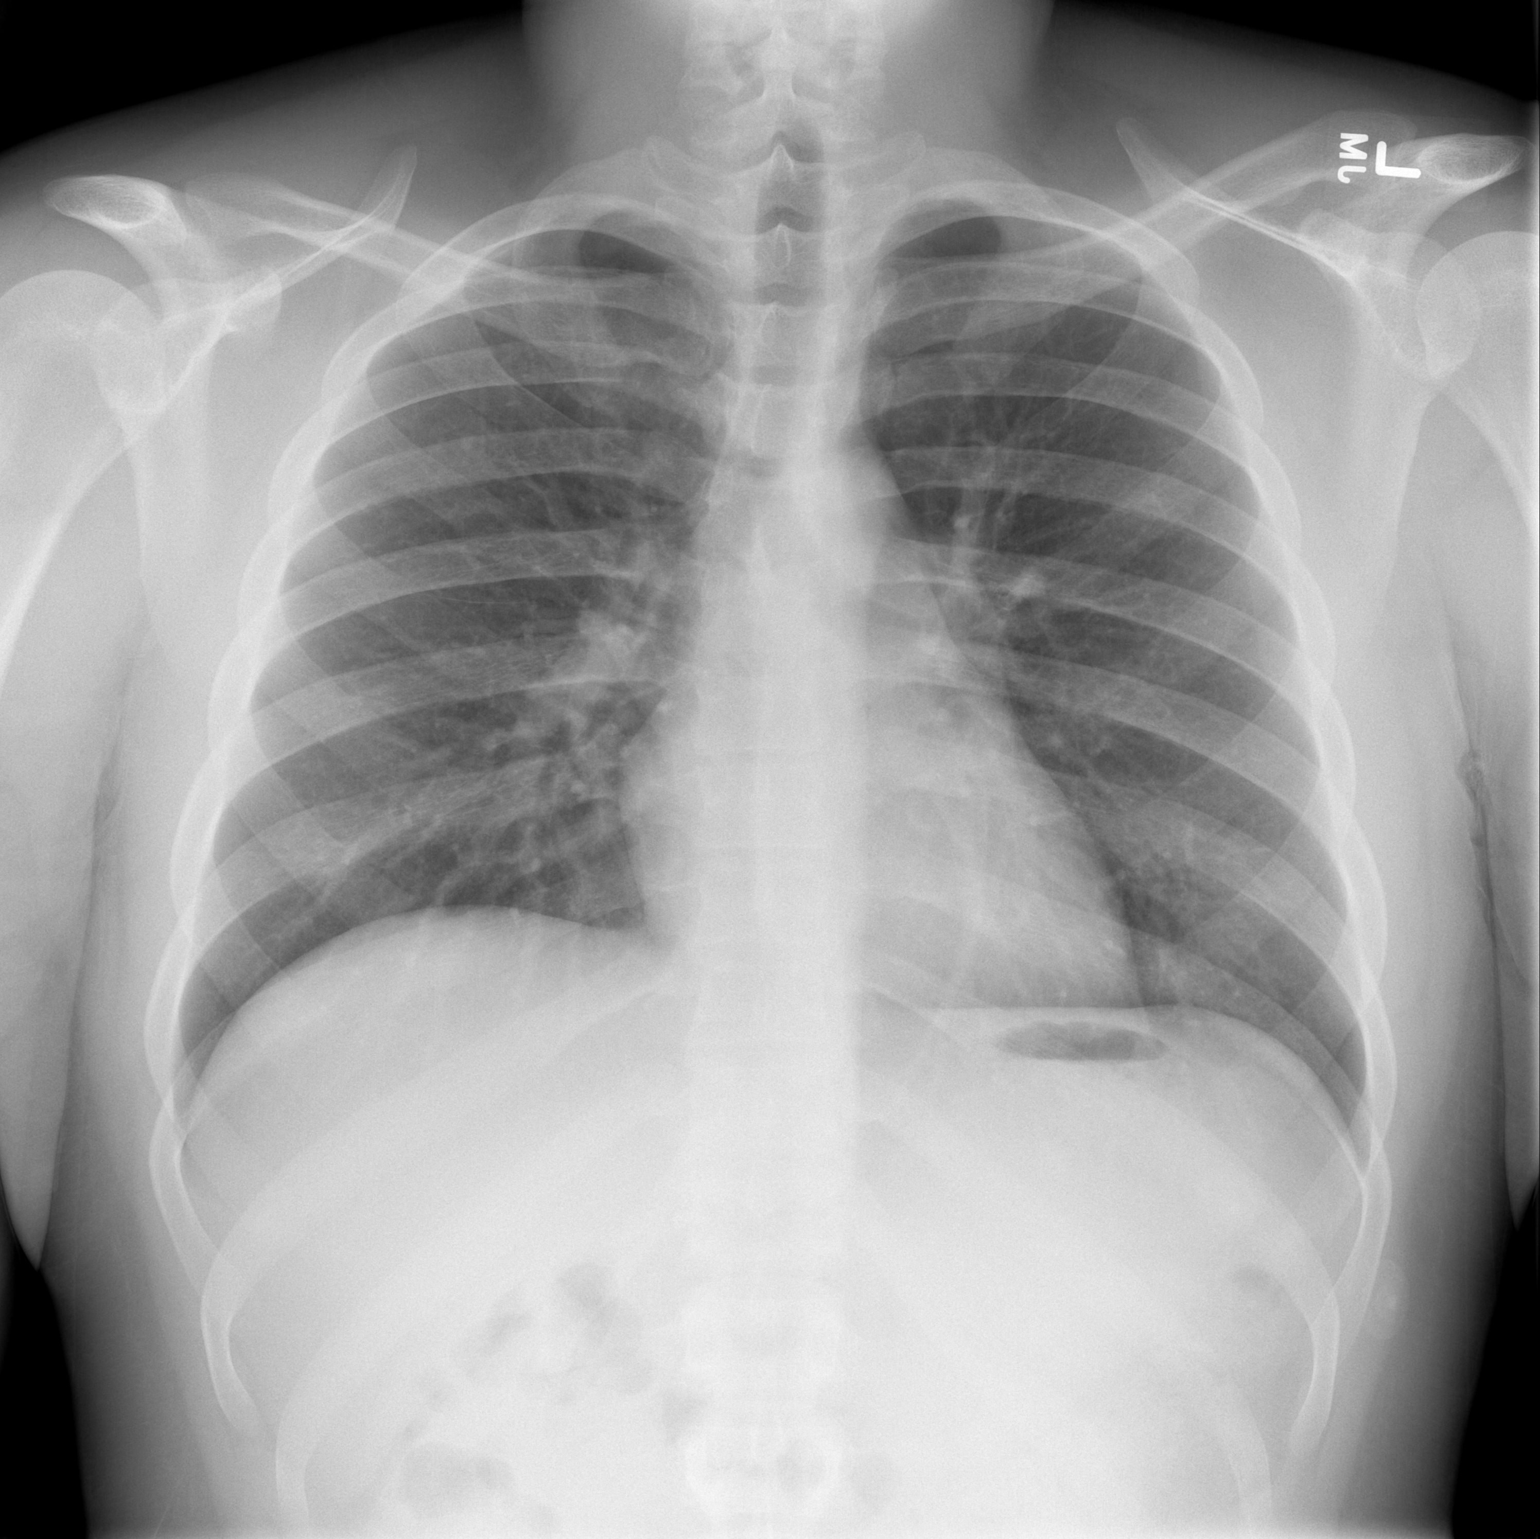

[w chest lat]
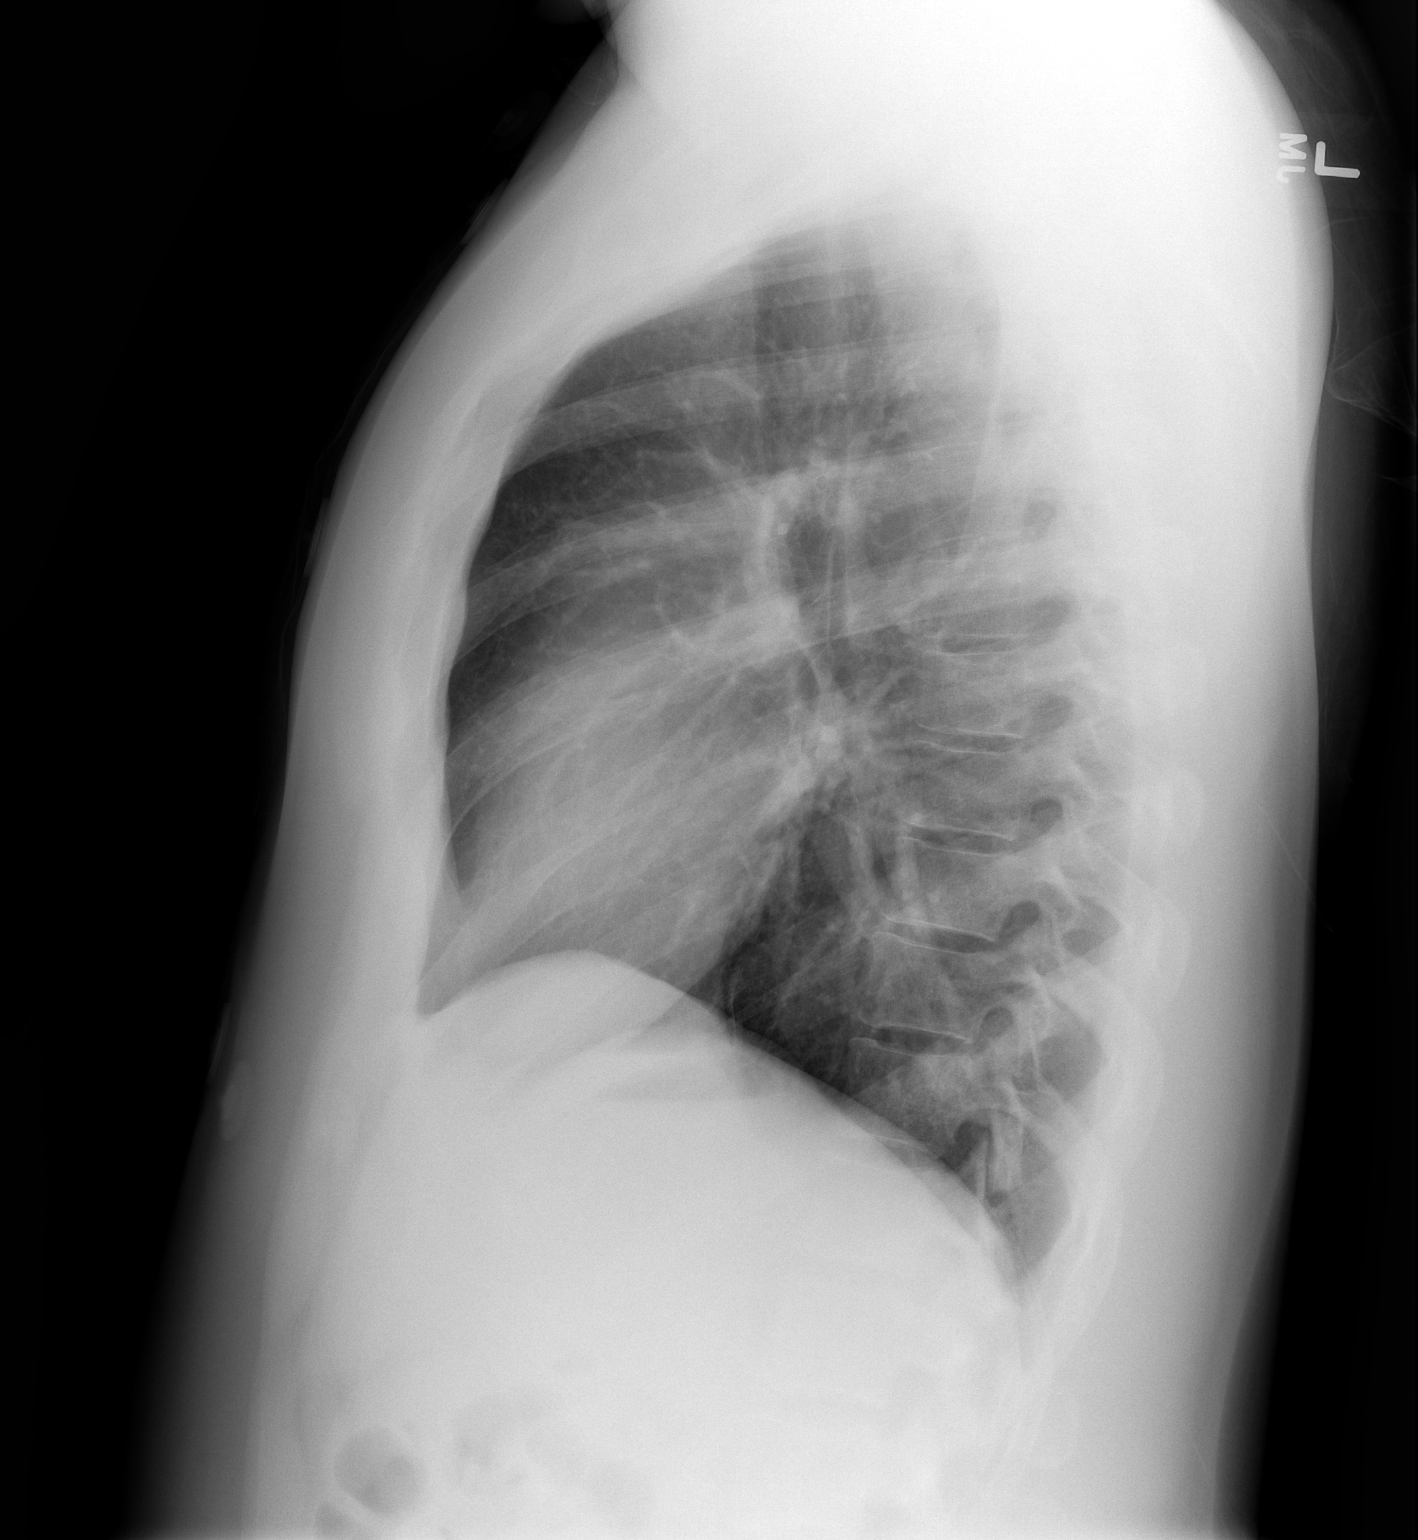

[2 of 2 positions shown; findings below may reference images not displayed]

FINDINGS: The lungs are well-expanded. There is no focal infiltrate. There is
no pleural effusion. The heart and pulmonary vascularity are normal.
The trachea is midline. The bony thorax is unremarkable.
IMPRESSION: There is no active cardiopulmonary disease.

## 2020-08-13 ENCOUNTER — Encounter (HOSPITAL_BASED_OUTPATIENT_CLINIC_OR_DEPARTMENT_OTHER): Payer: Self-pay | Admitting: Emergency Medicine

## 2020-08-13 ENCOUNTER — Emergency Department (HOSPITAL_BASED_OUTPATIENT_CLINIC_OR_DEPARTMENT_OTHER)
Admission: EM | Admit: 2020-08-13 | Discharge: 2020-08-13 | Disposition: A | Discharge status: Home / Self Care | Payer: BLUE CROSS/BLUE SHIELD | Attending: Emergency Medicine | Admitting: Emergency Medicine

## 2020-08-13 ENCOUNTER — Other Ambulatory Visit: Payer: Self-pay

## 2020-08-13 DIAGNOSIS — J069 Acute upper respiratory infection, unspecified: Secondary | ICD-10-CM

## 2020-08-13 DIAGNOSIS — J45909 Unspecified asthma, uncomplicated: Secondary | ICD-10-CM | POA: Insufficient documentation

## 2020-08-13 DIAGNOSIS — Z20822 Contact with and (suspected) exposure to covid-19: Secondary | ICD-10-CM | POA: Insufficient documentation

## 2020-08-13 DIAGNOSIS — F17210 Nicotine dependence, cigarettes, uncomplicated: Secondary | ICD-10-CM | POA: Insufficient documentation

## 2020-08-13 LAB — RESP PANEL BY RT-PCR (FLU A&B, COVID) ARPGX2
Influenza A by PCR: NEGATIVE
Influenza B by PCR: NEGATIVE
SARS Coronavirus 2 by RT PCR: POSITIVE — AB

## 2020-08-13 MED ORDER — ACETAMINOPHEN 325 MG PO TABS
650.0000 mg | ORAL_TABLET | Freq: Once | ORAL | Status: AC
  Administered 2020-08-13: 650 mg via ORAL
  Filled 2020-08-13: qty 2

## 2020-08-13 MED ORDER — BENZONATATE 100 MG PO CAPS: 100.0000 mg | ORAL_CAPSULE | Freq: Three times a day (TID) | ORAL | 0 refills | Status: AC

## 2020-08-13 NOTE — ED Triage Notes (Signed)
Reports head congestion, cough, body aches, and scratchy throat since Sunday.

## 2020-08-13 NOTE — ED Provider Notes (Signed)
[facility] EMERGENCY DEPARTMENT Provider Note   CSN: [id number] Arrival date & time: 08/13/20  1002     History Chief Complaint  Patient presents with  . URI    Andrew Torres is a 29 y.o. male who reports himself as otherwise healthy no daily medication use.  Of note patient quit smoking 14 months ago.  Patient presents today for generalized body aches, nonproductive cough, sore throat and generalized fatigue that began yesterday afternoon.  He rates his symptoms as mild.  He reports cough is mild, nonproductive without sputum production or associated chest pain or difficulty breathing.  He reports body aches of all of his major muscles which are mild completely relieved with ibuprofen no aggravating factors or radiation of aches.  He reports sore throat as a mild bilateral scratchy sensation which is constant improved with drinking no aggravating factors.  He reports he felt warm at home but has not measured a fever.  Denies fever/chills, headache, vision changes, neck stiffness, chest pain/shortness of breath, hemoptysis, abdominal pain, nausea/vomiting, diarrhea, extremity swelling/color change or any additional concerns.  Of note patient reports he has had 2 Covid vaccines earlier in 2021.  HPI     Past Medical History:  Diagnosis Date  . Asthma     Patient Active Problem List   Diagnosis Date Noted  . Preventative health care 10/31/2013  . 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 NOSE SURGERY  2006   Broken Nose       Family History  Problem Relation Age of Onset  . Diabetes Maternal Grandfather     Social History   Tobacco Use  . Smoking status: Current Every Day Smoker    Packs/day: 0.50    Years: 7.00    Pack years: 3.50    Types: Cigarettes    Start date: 11/01/2006  . Smokeless tobacco: Never Used  Vaping Use  . Vaping Use: Never used  Substance Use Topics  . Alcohol use: Yes     Comment: occ  . Drug use: No    Home Medications Prior to Admission medications   Medication Sig Start Date End Date Taking? Authorizing Provider  guaifenesin (ROBITUSSIN) 100 MG/5ML syrup Take 5-10 mLs (100-200 mg total) by mouth every 4 (four) hours as needed for cough. 01/19/18  Yes Mathews Robinsons B, PA-C  benzonatate (TESSALON) 100 MG capsule Take 1 capsule (100 mg total) by mouth every 8 (eight) hours. 08/13/20   Bill Salinas, PA-C  cetirizine (ZYRTEC ALLERGY) 10 MG tablet Take 1 tablet (10 mg total) by mouth daily. 01/19/18   Georgiana Shore, PA-C  ibuprofen (ADVIL,MOTRIN) 800 MG tablet Take 1 tablet (800 mg total) by mouth every 8 (eight) hours as needed for fever, headache or moderate pain. 08/10/18   Little, Ambrose Finland, MD  sodium chloride (OCEAN) 0.65 % SOLN nasal spray Place 1 spray into both nostrils as needed for congestion. 01/19/18   Georgiana Shore, PA-C    Allergies    Patient has no known allergies.  Review of Systems   Review of Systems  Constitutional: Positive for fatigue and fever (Subjective). Negative for chills.  HENT: Positive for rhinorrhea and sore throat. Negative for facial swelling, trouble swallowing and voice change.   Respiratory: Positive for cough. Negative for shortness of breath.   Cardiovascular: Negative.  Negative for chest pain and leg swelling.  Gastrointestinal: Negative.  Negative for abdominal pain, diarrhea, nausea and  vomiting.  Musculoskeletal: Positive for myalgias. Negative for neck pain and neck stiffness.  Neurological: Negative.  Negative for weakness and headaches.    Physical Exam Updated Vital Signs BP 138/80 (BP Location: Right Arm)   Pulse 87   Temp 98.3 F (36.8 C) (Oral)   Resp 18   Ht 5\' 9"  (1.753 m)   Wt 93.4 kg   SpO2 100%   BMI 30.42 kg/m   Physical Exam Constitutional:      General: He is not in acute distress.    Appearance: Normal appearance. He is well-developed. He is not ill-appearing  or diaphoretic.  HENT:     Head: Normocephalic and atraumatic.     Jaw: There is normal jaw occlusion.     Right Ear: External ear normal.     Left Ear: External ear normal.     Nose: Rhinorrhea present. Rhinorrhea is clear.     Right Sinus: No maxillary sinus tenderness or frontal sinus tenderness.     Left Sinus: No maxillary sinus tenderness or frontal sinus tenderness.     Mouth/Throat:     Comments: Mild posterior oropharynx cobblestoning and postnasal drip.  The patient has normal phonation and is in control of secretions. No stridor.  Midline uvula without edema. Soft palate rises symmetrically. No tonsillar erythema, swelling or exudates. Tongue protrusion is normal, floor of mouth is soft. No trismus. No creptius on neck palpation. No gingival erythema or fluctuance noted. Mucus membranes moist.  Eyes:     General: Vision grossly intact. Gaze aligned appropriately.     Extraocular Movements: Extraocular movements intact.     Conjunctiva/sclera: Conjunctivae normal.     Pupils: Pupils are equal, round, and reactive to light.  Neck:     Trachea: Trachea and phonation normal. No tracheal tenderness or tracheal deviation.     Meningeal: Brudzinski's sign absent.  Cardiovascular:     Rate and Rhythm: Normal rate and regular rhythm.  Pulmonary:     Effort: Pulmonary effort is normal. No respiratory distress.     Breath sounds: Normal breath sounds and air entry.  Abdominal:     General: There is no distension.     Palpations: Abdomen is soft.     Tenderness: There is no abdominal tenderness. There is no guarding or rebound.  Musculoskeletal:        General: Normal range of motion.     Cervical back: Normal range of motion and neck supple. No edema or rigidity.     Right lower leg: No edema.     Left lower leg: No edema.  Skin:    General: Skin is warm and dry.  Neurological:     Mental Status: He is alert.     GCS: GCS eye subscore is 4. GCS verbal subscore is 5. GCS motor  subscore is 6.     Comments: Speech is clear and goal oriented, follows commands Major Cranial nerves without deficit, no facial droop Moves extremities without ataxia, coordination intact  Psychiatric:        Behavior: Behavior normal.     ED Results / Procedures / Treatments   Labs (all labs ordered are listed, but only abnormal results are displayed) Labs Reviewed  RESP PANEL BY RT-PCR (FLU A&B, COVID) [id number]    EKG None  Radiology No results found.  Procedures Procedures (including critical care time)  Medications Ordered in ED Medications  acetaminophen (TYLENOL) tablet 650 mg (650 mg Oral Given 08/13/20 1039)    ED  Course  I have reviewed the triage vital signs and the nursing notes.  Pertinent labs & imaging results that were available during my care of the patient were reviewed by me and considered in my medical decision making (see chart for details).    MDM Rules/Calculators/A&P                         Additional history obtained from: 1. Nursing notes from this visit. 2. Review of electronic medical records.  No pertinent recent ER visits. - LOTTIE SISKA was evaluated in Emergency Department on 08/13/2020 for the symptoms described in the history of present illness. He was evaluated in the context of the global COVID-19 pandemic, which necessitated consideration that the patient might be at risk for infection with the SARS-CoV-2 virus that causes COVID-19. Institutional protocols and algorithms that pertain to the evaluation of patients at risk for COVID-19 are in a state of rapid change based on information released by regulatory bodies including the CDC and federal and state organizations. These policies and algorithms were followed during the patient's care in the ED.   29 year old otherwise healthy male presented for 1 day of URI symptoms he is experiencing nonproductive cough, mild sore throat, mild body aches and fatigue.  On exam he is well-appearing  no acute distress.  Vital signs are stable on room air.  Cranial nerves intact, no meningeal signs, airway clear, no evidence of PTA, RPA, Ludwig's, bacterial tonsillitis, sinusitis or other deep space infections of the head or neck.  Cardiopulmonary exam unremarkable.  Abdomen soft nontender.  Neurovascular tact all 4 extremities without evidence of DVT.  He reports symptoms are completely relieved with ibuprofen.  Patient reports he has had 2 Covid vaccines but does work with multiple coworkers daily unknown Covid exposures.  He is requesting Covid test.  Will order Covid/influenza panel.  There is no indication for admission or further work-up at this point.  Discussed potential of chest x-ray with patient have low suspicion for bacterial pneumonia or other acute cardiopulmonary etiologies given reassuring exam and history.  Patient requesting discharge and does not await for results.  Will discharge patient and he will follow-up on his Covid test on his MyChart account discussed those results with his PCP this week.  Patient urged to maintain his water hydration, get plenty of rest, social distance and he use OTC anti-inflammatories.  Centor score 0  At this time there does not appear to be any evidence of an acute emergency medical condition and the patient appears stable for discharge with appropriate outpatient follow up. Diagnosis was discussed with patient who verbalizes understanding of care plan and is agreeable to discharge. I have discussed return precautions with patient who verbalizes understanding. Patient encouraged to follow-up with their PCP. All questions answered.   Note: Portions of this report may have been transcribed using voice recognition software. Every effort was made to ensure accuracy; however, inadvertent computerized transcription errors may still be present. Final Clinical Impression(s) / ED Diagnoses Final diagnoses:  Viral URI with cough    Rx / DC Orders ED  Discharge Orders         Ordered    benzonatate (TESSALON) 100 MG capsule  Every 8 hours        08/13/20 1040           Elizabeth Palau 08/13/20 1041    LongArlyss Repress, MD 08/19/20 [phone]

## 2020-08-13 NOTE — Discharge Instructions (Signed)
At this time there does not appear to be the presence of an emergent medical condition, however there is always the potential for conditions to change. Please read and follow the below instructions.  Please return to the Emergency Department immediately for any new or worsening symptoms. Please be sure to follow up with your Primary Care Provider within one week regarding your visit today; please call their office to schedule an appointment even if you are feeling better for a follow-up visit. Your Covid test will result in the next 2-3 hours on your MyChart account.  Please log online and view your results.  Please discuss those results with your primary care provider at your follow-up visit. Please be sure to drink plenty of water to avoid dehydration and get plenty of rest.  You may use over-the-counter anti-inflammatory such as Tylenol as directed on the packaging to help with your symptoms.  You may use the medication Tessalon as prescribed to help with your cough.  Go to the nearest Emergency Department immediately if: You have fever or chills You feel pain or pressure in your chest. You have shortness of breath. You faint or feel like you will faint. You keep throwing up (vomiting). You have trouble breathing. You have pain or pressure in your chest. You have confusion. You have bluish lips and fingernails. You have belly pain, nausea or vomiting You have difficulty waking from sleep. You have any new/concerning or worsening of symptoms  Please read the additional information packets attached to your discharge summary.  Do not take your medicine if  develop an itchy rash, swelling in your mouth or lips, or difficulty breathing; call 911 and seek immediate emergency medical attention if this occurs.  You may review your lab tests and imaging results in their entirety on your MyChart account.  Please discuss all results of fully with your primary care provider and other specialist at  your follow-up visit.  Note: Portions of this text may have been transcribed using voice recognition software. Every effort was made to ensure accuracy; however, inadvertent computerized transcription errors may still be present.

## 2023-06-25 ENCOUNTER — Ambulatory Visit
Admission: EM | Admit: 2023-06-25 | Discharge: 2023-06-25 | Disposition: A | Discharge status: Home / Self Care | Payer: BC Managed Care – PPO | Attending: Family Medicine | Admitting: Family Medicine

## 2023-06-25 ENCOUNTER — Ambulatory Visit: Payer: BC Managed Care – PPO

## 2023-06-25 DIAGNOSIS — M65272 Calcific tendinitis, left ankle and foot: Secondary | ICD-10-CM

## 2023-06-25 DIAGNOSIS — M7752 Other enthesopathy of left foot: Secondary | ICD-10-CM

## 2023-06-25 MED ORDER — INDOMETHACIN 50 MG PO CAPS: 50.0000 mg | ORAL_CAPSULE | Freq: Two times a day (BID) | ORAL | 0 refills | Status: AC

## 2023-06-25 MED ORDER — INDOMETHACIN 50 MG PO CAPS: 50.0000 mg | ORAL_CAPSULE | Freq: Two times a day (BID) | ORAL | 0 refills | Status: DC

## 2023-06-25 NOTE — Discharge Instructions (Addendum)
Start indomethacin 50 mg twice daily with food for 7 days.  Continue to soak foot and warm Epsom salt this can help reduce inflammation along with taking the medication.  As discussed recommend purchasing insoles for your shoes to prevent injury to your left foot as she had plantar fasciitis.

## 2023-06-25 NOTE — ED Provider Notes (Signed)
[facility]    CSN: [id number] Arrival date & time: 06/25/23  1811      History   Chief Complaint Chief Complaint  Patient presents with   Foot Pain    HPI Andrew Torres is a 32 y.o. male.   Patient here today with left lateral foot pain. He endorses that he ran 4 miles on yesterday awaken with pain in left foot. He has taken tylenol for pain relief without improvement. Patient endorses chronic plantar fascitis.  Denies any know injury.     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 Authorizing Provider  benzonatate (TESSALON) 100 MG capsule Take 1 capsule (100 mg total) by mouth every 8 (eight) hours. 08/13/20   Bill Salinas, PA-C  cetirizine (ZYRTEC ALLERGY) 10 MG tablet Take 1 tablet (10 mg total) by mouth daily. 01/19/18   Georgiana Shore, PA-C  guaifenesin (ROBITUSSIN) 100 MG/5ML syrup Take 5-10 mLs (100-200 mg total) by mouth every 4 (four) hours as needed for cough. 01/19/18   Georgiana Shore, PA-C  ibuprofen (ADVIL,MOTRIN) 800 MG tablet Take 1 tablet (800 mg total) by mouth every 8 (eight) hours as needed for fever, headache or moderate pain. 08/10/18   Little, Ambrose Finland, MD  indomethacin (INDOCIN) 50 MG capsule Take 1 capsule (50 mg total) by mouth 2 (two) times daily with a meal. 06/25/23   Bing Neighbors, NP  sodium chloride (OCEAN) 0.65 % SOLN nasal spray Place 1 spray into both nostrils as needed for congestion. 01/19/18   Georgiana Shore, PA-C    Family History Family History  Problem Relation Age of Onset   Diabetes Maternal Grandfather     Social History Social History   Tobacco  Use   Smoking status: Every Day    Current packs/day: 0.50    Average packs/day: 0.5 packs/day for 16.7 years (8.3 ttl pk-yrs)    Types: Cigarettes    Start date: 11/01/2006   Smokeless tobacco: Never  Vaping Use   Vaping status: Never Used  Substance Use Topics   Alcohol use: Yes    Comment: occ   Drug use: No     Allergies   Patient has no known allergies.   Review of Systems Review of Systems Pertinent negatives listed in HPI   Physical Exam Triage Vital Signs ED Triage Vitals  Encounter Vitals Group     BP 06/25/23 1834 136/87     Systolic BP Percentile --      Diastolic BP Percentile --      Pulse Rate 06/25/23 1834 (!) 59     Resp 06/25/23 1834 16     Temp 06/25/23 1834 99.2 F (37.3 C)     Temp Source 06/25/23 1834 Oral     SpO2 06/25/23 1834 98 %     Weight --      Height --      Head Circumference --      Peak Flow --      Pain Score 06/25/23 1832 0     Pain Loc --  Pain Education --      Exclude from Growth Chart --    No data found.  Updated Vital Signs BP 136/87 (BP Location: Left Arm)   Pulse (!) 59   Temp 99.2 F (37.3 C) (Oral)   Resp 16   SpO2 98%   Visual Acuity Right Eye Distance:   Left Eye Distance:   Bilateral Distance:    Right Eye Near:   Left Eye Near:    Bilateral Near:     Physical Exam Vitals reviewed.  Constitutional:      Appearance: Normal appearance.  HENT:     Head: Normocephalic and atraumatic.     Nose: Nose normal.  Eyes:     Extraocular Movements: Extraocular movements intact.     Conjunctiva/sclera: Conjunctivae normal.     Pupils: Pupils are equal, round, and reactive to light.  Cardiovascular:     Rate and Rhythm: Normal rate and regular rhythm.  Pulmonary:     Effort: Pulmonary effort is normal.     Breath sounds: Normal breath sounds.  Musculoskeletal:        General: Normal range of motion.     Cervical back: Normal range of motion and neck supple.  Feet:     Comments: No swelling or  deformity of left foot  Skin:    General: Skin is warm.     Capillary Refill: Capillary refill takes less than 2 seconds.  Neurological:     General: No focal deficit present.     Mental Status: He is alert.      UC Treatments / Results  Labs (all labs ordered are listed, but only abnormal results are displayed) Labs Reviewed - No data to display  EKG   Radiology DG Foot Complete Left  Result Date: 06/25/2023 CLINICAL DATA:  Pain without known injury. EXAM: LEFT FOOT - COMPLETE 3+ VIEW COMPARISON:  None Available. FINDINGS: There is no evidence of fracture or dislocation. There is no evidence of arthropathy or other focal bone abnormality. Soft tissues are unremarkable. IMPRESSION: Negative. Electronically Signed   By: Charlett Nose M.D.   On: 06/25/2023 20:11    Procedures Procedures (including critical care time)  Medications Ordered in UC Medications - No data to display  Initial Impression / Assessment and Plan / UC Course  I have reviewed the triage vital signs and the nursing notes.  Pertinent labs & imaging results that were available during my care of the patient were reviewed by me and considered in my medical decision making (see chart for details).    1. Tendinitis of left foot - DG Foot Complete Left, unremarkable   - indomethacin (INDOCIN) 50 MG capsule; Take 1 capsule (50 mg total) by mouth 2 (two) times daily with a meal.  Dispense: 20 capsule.  Encouraged purchasing orthotics insert to prevent foot pain. Continue tylenol with anti-inflammatories as needed for pain.  Return precautions given.  Final Clinical Impressions(s) / UC Diagnoses   Final diagnoses:  Tendinitis of left foot     Discharge Instructions      Start indomethacin 50 mg twice daily with food for 7 days.  Continue to soak foot and warm Epsom salt this can help reduce inflammation along with taking the medication.  As discussed recommend purchasing insoles for your shoes to prevent  injury to your left foot as she had plantar fasciitis.     ED Prescriptions     Medication Sig Dispense Auth. Provider   indomethacin (INDOCIN) 50 MG  capsule  (Status: Discontinued) Take 1 capsule (50 mg total) by mouth 2 (two) times daily with a meal. 20 capsule Bing Neighbors, NP   indomethacin (INDOCIN) 50 MG capsule Take 1 capsule (50 mg total) by mouth 2 (two) times daily with a meal. 20 capsule Bing Neighbors, NP      PDMP not reviewed this encounter.   Bing Neighbors, NP 06/27/23 1244

## 2023-06-25 NOTE — ED Triage Notes (Signed)
Pt presents to UC w/ c/o left lateral foot pain since this morning. Pt states he ran 4 miles yesterday. He denies direct injury or twisting of foot. Denies pain at rest. Pain occurs after walking.  Pt has tried epsom salt, ice water, tylenol w/o relief
